# Patient Record
Sex: Male | Born: 1935 | Race: White | Hispanic: Yes | Marital: Single
Health system: Southern US, Community
[De-identification: ages and names within clinical notes are randomized; demographics above are authoritative.]

---

## 2010-06-24 HISTORY — PX: LEG SURGERY: SHX1003

## 2014-06-24 HISTORY — PX: CATARACT EXTRACTION: SUR2

## 2016-02-11 ENCOUNTER — Emergency Department (HOSPITAL_BASED_OUTPATIENT_CLINIC_OR_DEPARTMENT_OTHER)
Admission: EM | Admit: 2016-02-11 | Discharge: 2016-02-12 | Disposition: A | Discharge status: Home / Self Care | Payer: Medicare Other | Attending: Emergency Medicine | Admitting: Emergency Medicine

## 2016-02-11 ENCOUNTER — Emergency Department (HOSPITAL_BASED_OUTPATIENT_CLINIC_OR_DEPARTMENT_OTHER): Payer: Medicare Other

## 2016-02-11 ENCOUNTER — Emergency Department (HOSPITAL_COMMUNITY): Payer: Medicare Other

## 2016-02-11 ENCOUNTER — Encounter (HOSPITAL_BASED_OUTPATIENT_CLINIC_OR_DEPARTMENT_OTHER): Payer: Self-pay | Admitting: Pharmacy Technician

## 2016-02-11 DIAGNOSIS — R42 Dizziness and giddiness: Secondary | ICD-10-CM | POA: Insufficient documentation

## 2016-02-11 DIAGNOSIS — Z9889 Other specified postprocedural states: Secondary | ICD-10-CM | POA: Insufficient documentation

## 2016-02-11 DIAGNOSIS — R269 Unspecified abnormalities of gait and mobility: Secondary | ICD-10-CM

## 2016-02-11 DIAGNOSIS — R262 Difficulty in walking, not elsewhere classified: Secondary | ICD-10-CM | POA: Insufficient documentation

## 2016-02-11 LAB — URINALYSIS, ROUTINE W REFLEX MICROSCOPIC
Bilirubin Urine: NEGATIVE
GLUCOSE, UA: NEGATIVE mg/dL
KETONES UR: NEGATIVE mg/dL
Nitrite: NEGATIVE
PH: 7 (ref 5.0–8.0)
Protein, ur: NEGATIVE mg/dL
SPECIFIC GRAVITY, URINE: 1.012 (ref 1.005–1.030)

## 2016-02-11 LAB — RAPID URINE DRUG SCREEN, HOSP PERFORMED
Amphetamines: NOT DETECTED
Barbiturates: NOT DETECTED
Benzodiazepines: NOT DETECTED
COCAINE: NOT DETECTED
OPIATES: NOT DETECTED
TETRAHYDROCANNABINOL: NOT DETECTED

## 2016-02-11 LAB — CBC
HEMATOCRIT: 46 % (ref 39.0–52.0)
Hemoglobin: 15.9 g/dL (ref 13.0–17.0)
MCH: 31.1 pg (ref 26.0–34.0)
MCHC: 34.6 g/dL (ref 30.0–36.0)
MCV: 90 fL (ref 78.0–100.0)
Platelets: 211 10*3/uL (ref 150–400)
RBC: 5.11 MIL/uL (ref 4.22–5.81)
RDW: 14.7 % (ref 11.5–15.5)
WBC: 7.2 10*3/uL (ref 4.0–10.5)

## 2016-02-11 LAB — DIFFERENTIAL
BASOS PCT: 0 %
Basophils Absolute: 0 10*3/uL (ref 0.0–0.1)
EOS ABS: 0.1 10*3/uL (ref 0.0–0.7)
Eosinophils Relative: 1 %
Lymphocytes Relative: 12 %
Lymphs Abs: 0.8 10*3/uL (ref 0.7–4.0)
MONO ABS: 0.7 10*3/uL (ref 0.1–1.0)
MONOS PCT: 10 %
Neutro Abs: 5.6 10*3/uL (ref 1.7–7.7)
Neutrophils Relative %: 77 %

## 2016-02-11 LAB — COMPREHENSIVE METABOLIC PANEL
ALT: 14 U/L — AB (ref 17–63)
AST: 24 U/L (ref 15–41)
Albumin: 4.1 g/dL (ref 3.5–5.0)
Alkaline Phosphatase: 90 U/L (ref 38–126)
Anion gap: 10 (ref 5–15)
BUN: 17 mg/dL (ref 6–20)
CHLORIDE: 100 mmol/L — AB (ref 101–111)
CO2: 23 mmol/L (ref 22–32)
CREATININE: 1.27 mg/dL — AB (ref 0.61–1.24)
Calcium: 9.1 mg/dL (ref 8.9–10.3)
GFR, EST AFRICAN AMERICAN: 58 mL/min — AB (ref >60)
GFR, EST NON AFRICAN AMERICAN: 50 mL/min — AB (ref >60)
Glucose, Bld: 105 mg/dL — ABNORMAL HIGH (ref 65–99)
POTASSIUM: 4.4 mmol/L (ref 3.5–5.1)
Sodium: 133 mmol/L — ABNORMAL LOW (ref 135–145)
Total Bilirubin: 0.8 mg/dL (ref 0.3–1.2)
Total Protein: 8.1 g/dL (ref 6.5–8.1)

## 2016-02-11 LAB — PROTIME-INR
INR: 1.18
PROTHROMBIN TIME: 15.1 s (ref 11.4–15.2)

## 2016-02-11 LAB — URINE MICROSCOPIC-ADD ON

## 2016-02-11 LAB — APTT: APTT: 32 s (ref 24–36)

## 2016-02-11 LAB — ETHANOL: Alcohol, Ethyl (B): <5 mg/dL (ref <5)

## 2016-02-11 MED ORDER — SODIUM CHLORIDE 0.9 % IV BOLUS (SEPSIS)
1000.0000 mL | Freq: Once | INTRAVENOUS | Status: AC
  Administered 2016-02-11: 1000 mL via INTRAVENOUS

## 2016-02-11 NOTE — ED Notes (Signed)
Patient transported to CT 

## 2016-02-11 NOTE — ED Notes (Signed)
[name] [phone] to update patient and ensure all questions for patient are answered.  Patient updated that [facility] will arrive shortly to take him to [facility], Patient states no questions at this time.

## 2016-02-11 NOTE — ED Provider Notes (Signed)
[facility] Provider Note   CSN: [id number] Arrival date & time: 02/11/16  1309     History   Chief Complaint Chief Complaint  Patient presents with  . Dizziness    HPI Charles Christensen is a 82 y.o. male.  Level V caveat for language barrier. Patient presents with dizziness that onset this morning at 8 AM. Associated with increased difficulty walking. Patient usually uses a cane but his walking is worse than baseline per family members in the room. He denies any focal weakness, numbness or tingling. No facial droop. No difficulty speaking or swallowing. Patient underwent some kind of urological surgery 5 days ago and has a Foley catheter in place. They deny any cardiac history. Denies any chest pain or shortness of breath. They deny any abdominal pain, nausea or vomiting. They deny patient ever having a stroke in the past. Patient traveled to [place] frequently. He is only on Bactrim and tramadol currently. No other medications. He denies any visual change. Dizziness is described as room spinning as well as lightheadedness that is worse with standing up. Similar episode about one year ago that they were told might be due to his medications.   The history is provided by the patient and a caregiver. The history is limited by the condition of the patient and a language barrier.  Dizziness  Associated symptoms: no chest pain, no nausea, no shortness of breath, no vomiting and no weakness     History reviewed. No pertinent past medical history.  There are no active problems to display for this patient.   Past Surgical History:  Procedure Laterality Date  . CATARACT EXTRACTION Bilateral 2016  . LEG SURGERY  2012       Home Medications    Prior to Admission medications   Medication Sig Start Date End Date Taking? Authorizing Provider  sulfamethoxazole-trimethoprim (BACTRIM DS,SEPTRA DS) 800-160 MG tablet Take 1 tablet by mouth 2 (two) times daily.   Yes Historical  Provider, MD  traMADol (ULTRAM) 50 MG tablet Take by mouth every 6 (six) hours as needed.   Yes Historical Provider, MD    Family History No family history on file.  Social History Social History  Substance Use Topics  . Smoking status: Not on file  . Smokeless tobacco: Not on file  . Alcohol use Not on file     Allergies   Review of patient's allergies indicates no known allergies.   Review of Systems Review of Systems  Constitutional: Negative for activity change, appetite change and fever.  HENT: Negative for congestion and postnasal drip.   Eyes: Negative for photophobia and visual disturbance.  Respiratory: Negative for cough, chest tightness and shortness of breath.   Cardiovascular: Negative for chest pain.  Gastrointestinal: Negative for abdominal pain, nausea and vomiting.  Endocrine: Negative for polyuria.  Genitourinary: Negative for dysuria, hematuria and testicular pain.  Musculoskeletal: Negative for arthralgias and myalgias.  Skin: Negative for rash.  Neurological: Positive for dizziness and light-headedness. Negative for weakness.   A complete 10 system review of systems was obtained and all systems are negative except as noted in the HPI and PMH.    Physical Exam Updated Vital Signs Ht 5\' 4"  (1.626 m)   Wt 149 lb (67.6 kg)   BMI 25.58 kg/m   Physical Exam  Constitutional: He is oriented to person, place, and time. He appears well-developed and well-nourished. No distress.  HENT:  Head: Normocephalic and atraumatic.  Mouth/Throat: Oropharynx is clear and moist. No  oropharyngeal exudate.  Eyes: Conjunctivae and EOM are normal. Pupils are equal, round, and reactive to light.  Neck: Normal range of motion. Neck supple.  No meningismus.  Cardiovascular: Normal rate, regular rhythm, normal heart sounds and intact distal pulses.   No murmur heard. Pulmonary/Chest: Effort normal and breath sounds normal. No respiratory distress.  Abdominal: Soft. There  is no tenderness. There is no rebound and no guarding.  Genitourinary:  Genitourinary Comments: Foley catheter in place  Musculoskeletal: Normal range of motion. He exhibits no edema or tenderness.  Neurological: He is alert and oriented to person, place, and time. No cranial nerve deficit. He exhibits normal muscle tone. Coordination normal.  No ataxia on finger to nose bilaterally. No pronator drift. 5/5 strength throughout. CN 2-12 intact.Equal grip strength. Sensation intact.  Patient has difficulty standing with his cane which is worse than usual. Positive Romberg. Unable to test gait due to safety. No nystagmus  Skin: Skin is warm.  Psychiatric: He has a normal mood and affect. His behavior is normal.  Nursing note and vitals reviewed.    ED Treatments / Results  Labs (all labs ordered are listed, but only abnormal results are displayed) Labs Reviewed  COMPREHENSIVE METABOLIC PANEL - Abnormal; Notable for the following:       Result Value   Sodium 133 (*)    Chloride 100 (*)    Glucose, Bld 105 (*)    Creatinine, Ser 1.27 (*)    ALT 14 (*)    GFR calc non Af Amer 50 (*)    GFR calc Af Amer 58 (*)    All other components within normal limits  URINALYSIS, ROUTINE W REFLEX MICROSCOPIC (NOT AT [facility]) - Abnormal; Notable for the following:    Hgb urine dipstick MODERATE (*)    Leukocytes, UA SMALL (*)    All other components within normal limits  URINE MICROSCOPIC-ADD ON - Abnormal; Notable for the following:    Squamous Epithelial / LPF 0-5 (*)    Bacteria, UA RARE (*)    All other components within normal limits  ETHANOL  CBC  DIFFERENTIAL  URINE RAPID DRUG SCREEN, HOSP PERFORMED    EKG  EKG Interpretation  Date/Time:  Sunday February 11 2016 14:04:37 EDT Ventricular Rate:  104 PR Interval:    QRS Duration: 94 QT Interval:  329 QTC Calculation: 433 R Axis:   71 Text Interpretation:  Sinus tachycardia Borderline T abnormalities, anterior leads anterior ST depressions  No previous ECGs available Confirmed by Manus GunningANCOUR  MD, Kuron Docken [phone]([id number]) on 02/11/2016 2:52:09 PM       Radiology Ct Head Wo Contrast  Result Date: 02/11/2016 CLINICAL DATA:  Dizziness since 8 a.m.  Denies pain and numbness. EXAM: CT HEAD WITHOUT CONTRAST TECHNIQUE: Contiguous axial images were obtained from the base of the skull through the vertex without intravenous contrast. COMPARISON:  None. FINDINGS: 1 Cm sized foci of hypoattenuation are seen in the LEFT cerebellum, of indeterminate age. Possible similar abnormality in the RIGHT mid pons. If no contraindications, MRI of the brain without contrast could be helpful in determining acute versus chronic insults. No cortical hypodensities are evident. No hemorrhage, mass lesion, or extra-axial fluid. Global atrophy. Hypoattenuation of white matter suggesting chronic microvascular ischemic change. Chronic lacunar infarct LEFT basal ganglia. Calvarium intact. No sinus or mastoid disease. 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 If no contraindications, MRI brain without contrast recommended. Electronically Signed   By: Dale DurhamJohn T Curnes M.D.  On: 02/11/2016 15:34    Procedures Procedures (including critical care time)  Medications Ordered in ED Medications  sodium chloride 0.9 % bolus 1,000 mL (1,000 mLs Intravenous New Bag/Given 02/11/16 1526)     Initial Impression / Assessment and Plan / ED Course  I have reviewed the triage vital signs and the nursing notes.  Pertinent labs & imaging results that were available during my care of the patient were reviewed by me and considered in my medical decision making (see chart for details).  Clinical Course  82 y/o male presents with difficulty standing and lightheadedness and difficulty walking onset 8 AM this morning. Last seen normal last night. Code stroke not activated due to delayed presentation.  Concern for central vertigo. Patient is  slightly orthostatic heart rate elevates to 110 with standing. Will give gentle IVF.  Patient has increased difficulty walking and standing since this morning. CT scan shows age-indeterminate cerebellar infarcts. MRI is not available at this facility.  D/w Dr. Elliot Gurney at [facility].  MRI not available on Sunday.  D/w Dr. Rosalia Hammers who accepts patient to [facility] ED for MRI.  Patient agreeable.  Final Clinical Impressions(s) / ED Diagnoses   Final diagnoses:  Vertigo  Gait abnormality    New Prescriptions New Prescriptions   No medications on file     Glynn Octave, MD 02/11/16 1643

## 2016-02-11 NOTE — ED Provider Notes (Signed)
Patient was seen earlier today at approximately 1300 at [facility] for dizziness and ataxia that started at approximately 0800 today. No focal weakness, numbness, tingling, facial drooping, trouble speaking or swallowing. His CT scan reveals an age-indeterminate left cerebellar and right paramedian pontine infarct.  He has been [facility] for a definitive MRI scan. If MRI is positive, patient will be admitted to the hospital. If MRI is negative, patient we discharged home.   Discussed with Antony MaduraKelly Humes [facility]   Donnetta HutchingBrian Ledora Delker, MD 02/11/16 [phone]

## 2016-02-11 NOTE — ED Notes (Signed)
Patient transported to MRI 

## 2016-02-11 NOTE — ED Notes (Signed)
Pt on automatic VS.  

## 2016-02-11 NOTE — ED Triage Notes (Signed)
Family member sts that pt c/o dizziness since 8:00am. No drooping to face and pt denies pain/numbness. Family member sts that pt is speaking normally but is forgetful more than usual. Pt had urinary surgery on Tues

## 2016-02-12 MED ORDER — MECLIZINE HCL 25 MG PO TABS: 25.0000 mg | ORAL_TABLET | Freq: Three times a day (TID) | ORAL | 0 refills | Status: AC | PRN

## 2016-02-12 NOTE — ED Provider Notes (Signed)
12:39 AM Patient's MRI has been reviewed and is negative for acute or subacute infarct. Dizziness likely secondary to BPPV. Results reviewed with the patient and family using Language Line. Patient and family verbalize understanding of results. Patient advised to follow up with his PCP regarding his ED visit today. Return precautions provided. Patient discharged in satisfactory condition.  Vitals:   02/11/16 1834 02/11/16 1900 02/11/16 2217 02/12/16 0036  BP: 99/76 109/73 112/79 115/72  Pulse: 84 80 85 78  Resp: 18 18 20    SpO2: 93% 94% 93% 97%  Weight:      Height:       [name] Contrast  Result Date: 02/12/2016 CLINICAL DATA:  Dizziness and ataxia. EXAM: MRI HEAD WITHOUT CONTRAST TECHNIQUE: Multiplanar, multiecho pulse sequences of the brain and surrounding structures were obtained without intravenous contrast. COMPARISON:  Head CT 02/11/2016 FINDINGS: Brain Parenchyma: No acute infarct or intraparenchymal hemorrhage. No focal parenchymal signal abnormality. Prominent perivascular space near the left external capsule. No mass lesion or midline shift. The major intracranial flow voids are preserved. The midline structures are normal. Ventricles, Sulci and Extra-axial Spaces: Normal for age. No extra-axial collection. Paranasal Sinuses and Mastoids: No fluid levels or advanced mucosal thickening. Orbits: Bilateral lens replacements.  Otherwise normal. Bones and Soft Tissues: The visualized skull base, calvarium and extracranial soft tissues are normal. IMPRESSION: Normal brain MRI for age.  No acute intracranial abnormality. Electronically Signed   By: Deatra RobinsonKevin  Herman M.D.   On: 02/12/2016 00:19      Antony MaduraKelly Charniece Venturino, PA-C 02/12/16 [id number]    Lyndal Pulleyaniel Knott, MD 02/12/16 [phone]

## 2016-02-12 NOTE — Discharge Instructions (Signed)
Your MRI today did not show evidence of a stroke. We would recommend you see your doctor to discuss your visit to the Emergency Department today. You may take Meclizine as prescribed if you continue to experience dizziness. Return to the Emergency Department if symptoms worsen.  Su resonancia magntica hoy no mostr evidencia de un derrame cerebral. Le recomendamos que consulte a su mdico para hablar de su visita al [facility]. Usted puede tomar Meclizine segn lo prescrito si contina experimentando mareos. Vuelva al [facility] si los sntomas empeoran.

## 2017-09-01 IMAGING — MR MR HEAD W/O CM
9 of 10 series · 35 of 48 positions shown · non-contrast
Comparison: Head CT 02/11/2016

CLINICAL DATA: Dizziness and ataxia.

EXAM:
MRI HEAD WITHOUT CONTRAST
TECHNIQUE: Multiplanar, multiecho pulse sequences of the brain and surrounding
structures were obtained without intravenous contrast.

[Series 4: T1 · sagittal · 5.0mm · 0.47mm/px · 1 of 25 slices shown]
[im 1/25]
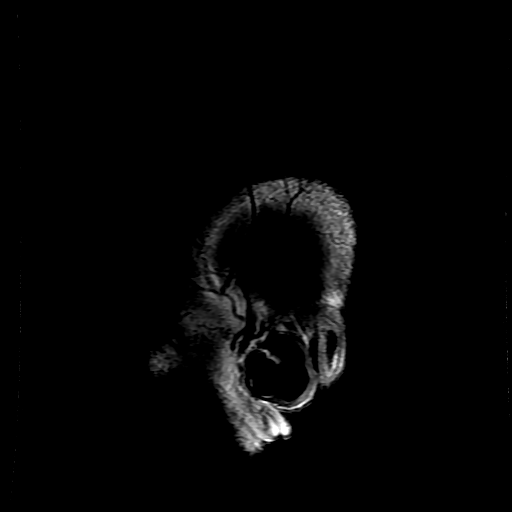

[Series 5: DWI · axial · 3.0mm · 1.09mm/px · z∈[+18,+165]mm · 8 of 102 slices shown (1 of 4)]
[im 1/102]
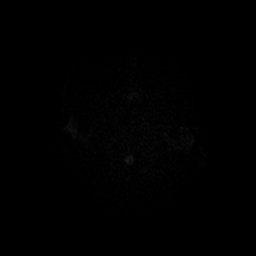
[im 12/102]
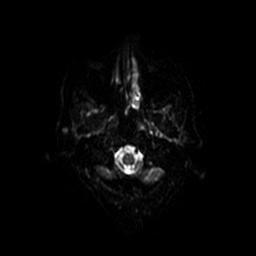
[im 34/102]
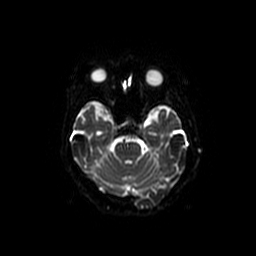
[im 45/102]
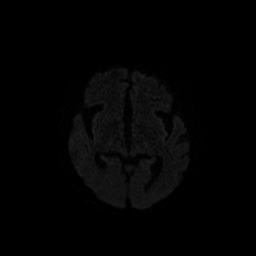
[im 57/102]
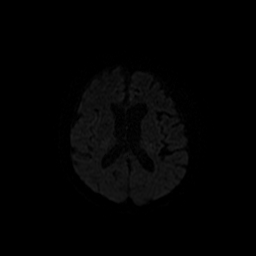
[im 68/102]
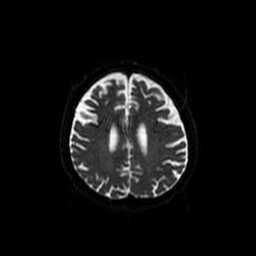
[im 90/102]
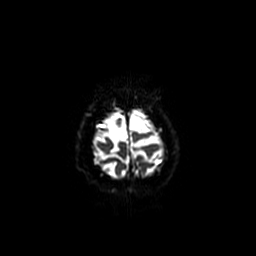
[im 102/102]
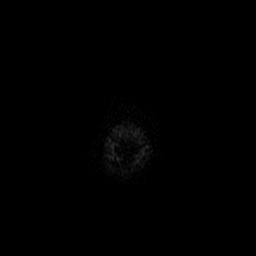

[Series 6: DWI · coronal · 5.0mm · 1.09mm/px · 7 of 70 slices shown (2 of 4)]
[im 1/70]
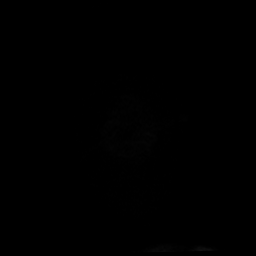
[im 12/70]
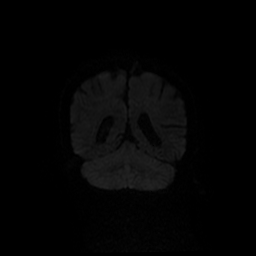
[im 24/70]
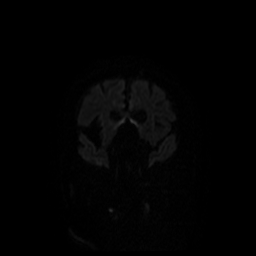
[im 35/70]
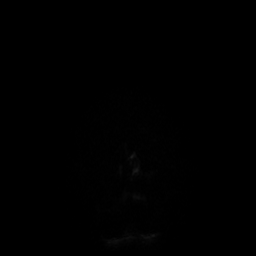
[im 47/70]
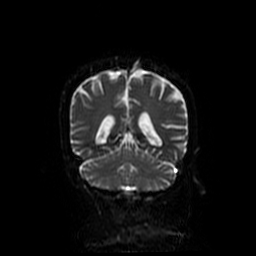
[im 58/70]
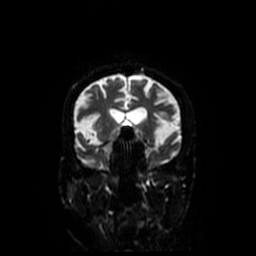
[im 70/70]
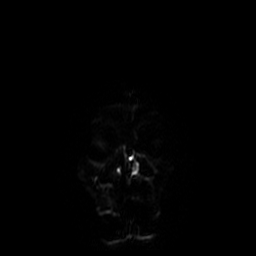

[Series 9: ax mpgr · axial · 5.0mm · 0.43mm/px · z∈[+9,+79]mm · 2 of 26 slices shown]
[im 1/26]
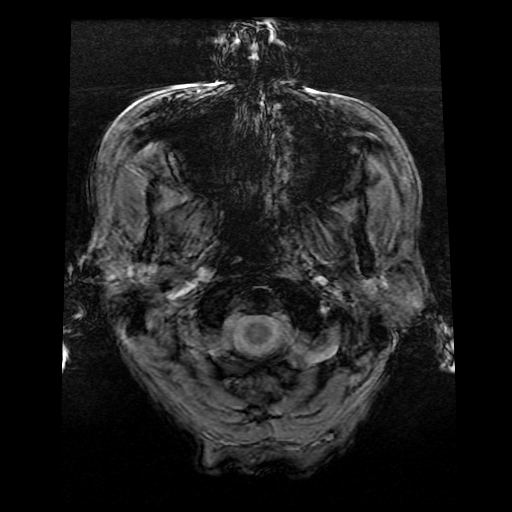
[im 13/26]
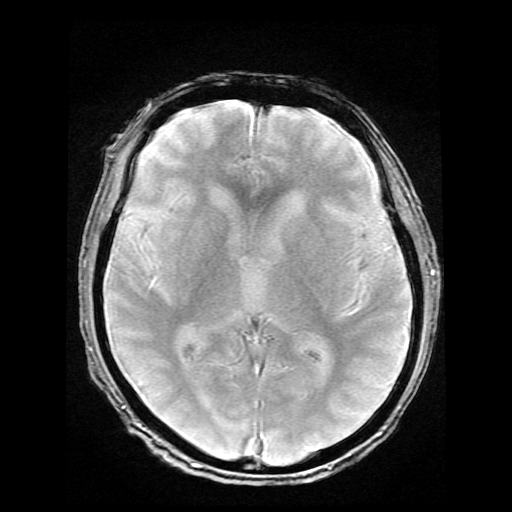

[Series 11: T2 · coronal · 5.0mm · 0.39mm/px · 3 of 27 slices shown (1 of 2)]
[im 1/27]
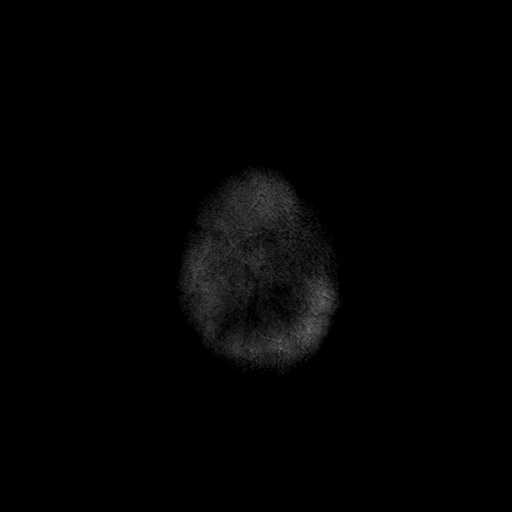
[im 14/27]
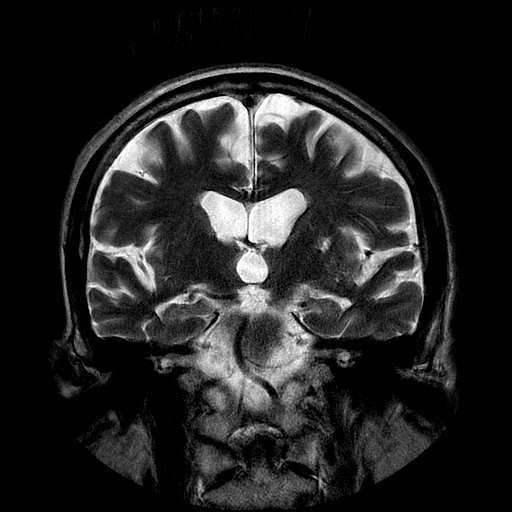
[im 27/27]
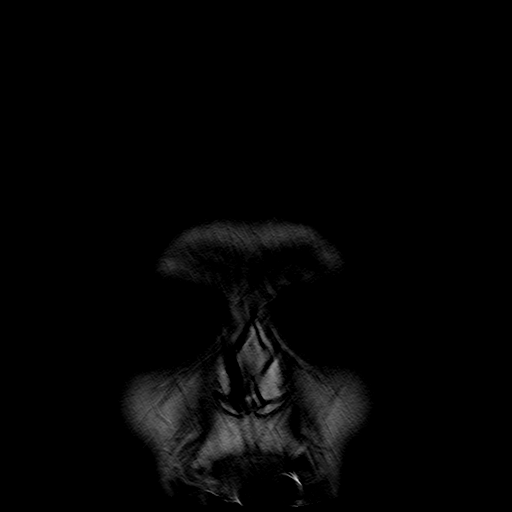

[Series 12: FLAIR · axial · 5.0mm · 0.43mm/px · z∈[+9,+155]mm · 3 of 26 slices shown]
[im 1/26]
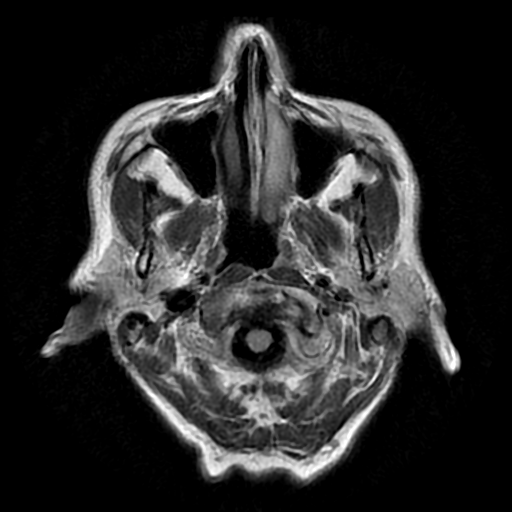
[im 13/26]
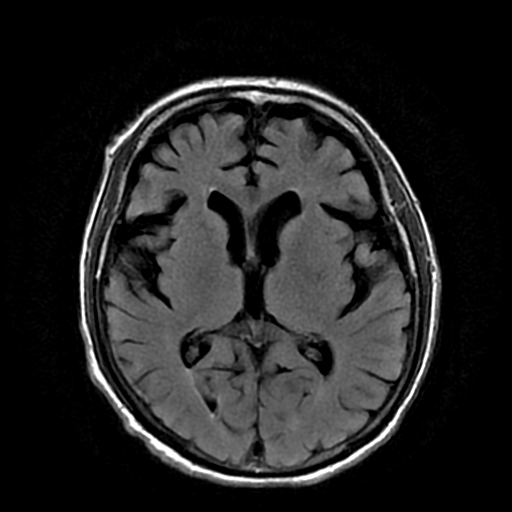
[im 26/26]
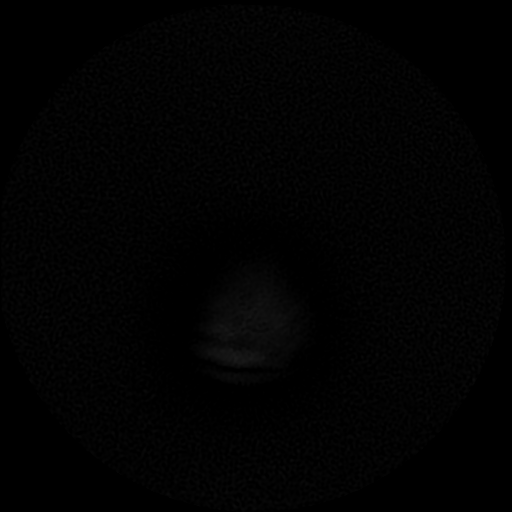

[Series 13: T2 · axial · 5.0mm · 0.43mm/px · z∈[+9,+155]mm · 3 of 26 slices shown (2 of 2)]
[im 1/26]
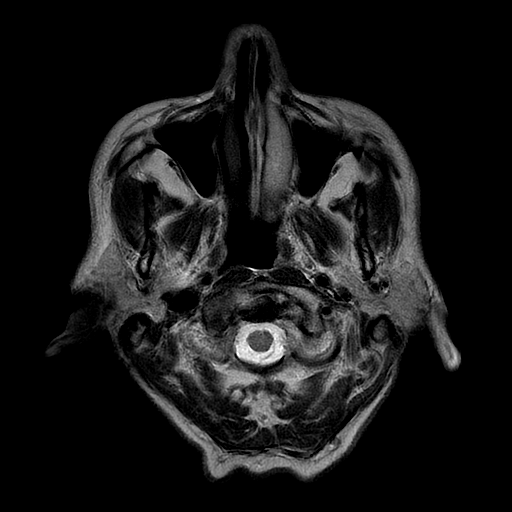
[im 13/26]
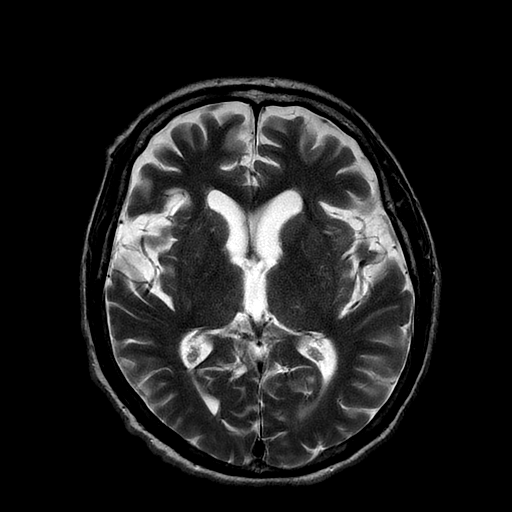
[im 26/26]
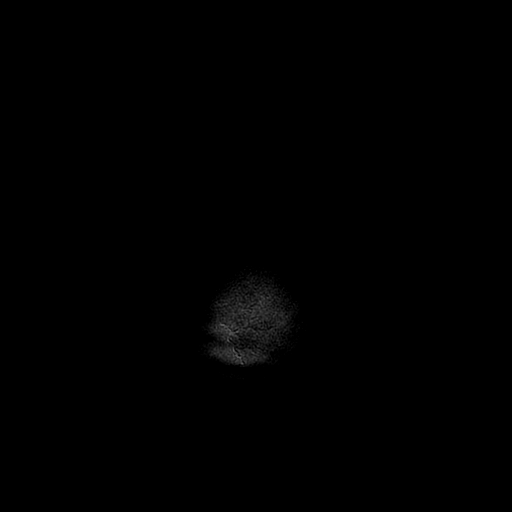

[Series 500: DWI · axial · 3.0mm · 1.09mm/px · z∈[+18,+165]mm · 5 of 51 slices shown (3 of 4)]
[im 1/51]
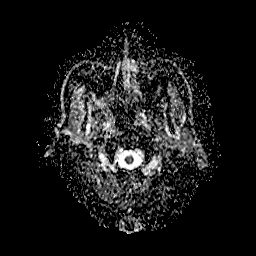
[im 13/51]
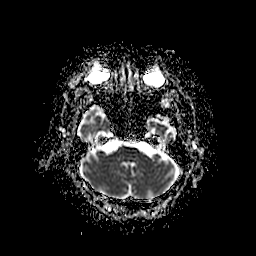
[im 26/51]
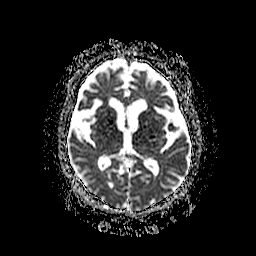
[im 38/51]
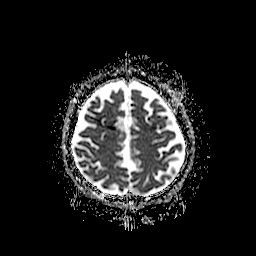
[im 51/51]
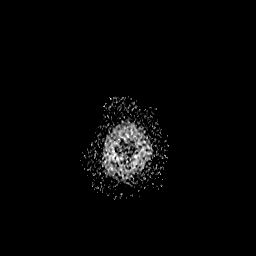

[Series 600: DWI · coronal · 5.0mm · 1.09mm/px · 3 of 35 slices shown (4 of 4)]
[im 1/35]
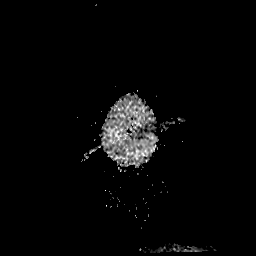
[im 18/35]
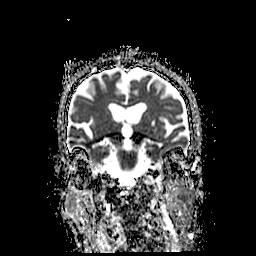
[im 35/35]
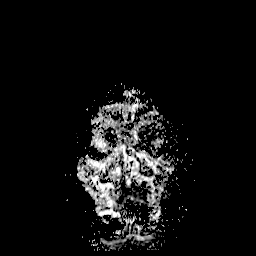

[35 of 48 positions shown; findings below may reference images not displayed]

FINDINGS: Brain Parenchyma: No acute infarct or intraparenchymal hemorrhage.
No focal parenchymal signal abnormality. Prominent perivascular
space near the left external capsule. No mass lesion or midline
shift. The major intracranial flow voids are preserved. The midline
structures are normal.

Ventricles, Sulci and Extra-axial Spaces: Normal for age. No
extra-axial collection.

Paranasal Sinuses and Mastoids: No fluid levels or advanced mucosal
thickening.

Orbits: Bilateral lens replacements.  Otherwise normal.

Bones and Soft Tissues: The visualized skull base, calvarium and
extracranial soft tissues are normal.
IMPRESSION: Normal brain MRI for age.  No acute intracranial abnormality.

## 2019-11-23 DEATH — deceased
